# Patient Record
Sex: Male | Born: 1980 | Race: White | Hispanic: No | Marital: Married | Smoking: Never smoker
Health system: Southern US, Community
[De-identification: ages and names within clinical notes are randomized; demographics above are authoritative.]

## PROBLEM LIST (undated history)

## (undated) DIAGNOSIS — S060XAA Concussion with loss of consciousness status unknown, initial encounter: Secondary | ICD-10-CM

## (undated) DIAGNOSIS — J939 Pneumothorax, unspecified: Secondary | ICD-10-CM

## (undated) DIAGNOSIS — S060X9A Concussion with loss of consciousness of unspecified duration, initial encounter: Secondary | ICD-10-CM

---

## 2013-01-30 ENCOUNTER — Ambulatory Visit: Payer: Self-pay | Admitting: Urology

## 2014-08-10 NOTE — Op Note (Signed)
PATIENT NAME:  Rick DaltonMARTIN, Shadee L MR#:  [id number] DATE OF BIRTH:  25-Dec-1980  DATE OF PROCEDURE:  01/30/2013  PREOPERATIVE DIAGNOSIS: Desires infertility.   POSTOPERATIVE DIAGNOSIS: Desires infertility.  PROCEDURE: Bilateral partial vasectomy.   ANESTHESIA: Local MAC.  DESCRIPTION OF PROCEDURE: An appropriate timeout was held. I anesthetized the midline of the scrotum. Then, I anesthetized each vas utilizing a combination of 1% Xylocaine and 0.5% Marcaine. Once they are anesthetized, I made a small incision in the midline of the scrotum and both sides are done exactly the same. The first side is the right side. I locate the vas, bring it into the incision carefully, freeing it of most of its attachments. The vas is then grasped with a small towel clamp. I incised down through the tunics surrounding the mass and isolate the vas itself, peel away all its blood supply. Then I put two 3-0 Vicryl ties at the top of the loop. I then put a stat on the proximal side of the vas and incised the vas above it. Then, a small piece was taken away from the proximal end. The distal end of the vas that has been sutured is then cauterized and dropped back into the scrotum. Then with needlepoint, I cauterized the other end and covered over and separated from the distal vas utilizing a 3-0 Vicryl through the tunics. This was repeated on the opposite side. Minimal bleeding was controlled with electrocautery. One single suture is utilized for the incision of  4-0 Vicryl and I sent the patient to recovery in satisfactory condition with sterile dressings and a Dermabond covering the incision.    ____________________________ Caralyn Guileichard D. Edwyna ShellHart, DO rdh:aw D: 01/30/2013 15:27:36 ET T: 01/30/2013 15:37:44 ET JOB#: [id number]  cc: Caralyn Guileichard D. Edwyna ShellHart, DO, <Dictator> Lott Seelbach D Asiah Browder DO ELECTRONICALLY SIGNED 03/13/2013 7:01

## 2015-02-15 ENCOUNTER — Other Ambulatory Visit: Payer: Self-pay | Admitting: Family Medicine

## 2015-02-15 DIAGNOSIS — R1011 Right upper quadrant pain: Secondary | ICD-10-CM

## 2015-02-19 ENCOUNTER — Ambulatory Visit
Admission: RE | Admit: 2015-02-19 | Discharge: 2015-02-19 | Disposition: A | Discharge status: Home / Self Care | Payer: BLUE CROSS/BLUE SHIELD | Source: Ambulatory Visit | Attending: Family Medicine | Admitting: Family Medicine

## 2015-02-19 ENCOUNTER — Other Ambulatory Visit: Payer: Self-pay | Admitting: Family Medicine

## 2015-02-19 DIAGNOSIS — R1011 Right upper quadrant pain: Secondary | ICD-10-CM | POA: Insufficient documentation

## 2015-02-20 ENCOUNTER — Other Ambulatory Visit: Payer: Self-pay | Admitting: Family Medicine

## 2015-02-20 DIAGNOSIS — R1011 Right upper quadrant pain: Secondary | ICD-10-CM

## 2015-02-28 ENCOUNTER — Ambulatory Visit
Admission: RE | Admit: 2015-02-28 | Discharge: 2015-02-28 | Disposition: A | Discharge status: Home / Self Care | Payer: BLUE CROSS/BLUE SHIELD | Source: Ambulatory Visit | Attending: Family Medicine | Admitting: Family Medicine

## 2015-02-28 ENCOUNTER — Ambulatory Visit: Payer: BLUE CROSS/BLUE SHIELD

## 2015-02-28 DIAGNOSIS — R1011 Right upper quadrant pain: Secondary | ICD-10-CM | POA: Diagnosis present

## 2015-02-28 DIAGNOSIS — K839 Disease of biliary tract, unspecified: Secondary | ICD-10-CM | POA: Insufficient documentation

## 2015-02-28 MED ORDER — TECHNETIUM TC 99M MEBROFENIN IV KIT
5.0000 | PACK | Freq: Once | INTRAVENOUS | Status: DC | PRN
  Administered 2015-02-28: 5.416 via INTRAVENOUS
  Filled 2015-02-28: qty 6

## 2015-02-28 MED ORDER — SINCALIDE 5 MCG IJ SOLR
0.0200 ug/kg | Freq: Once | INTRAMUSCULAR | Status: AC
  Administered 2015-02-28: 2.36 ug via INTRAVENOUS
  Filled 2015-02-28: qty 5

## 2017-06-16 ENCOUNTER — Emergency Department
Admission: EM | Admit: 2017-06-16 | Discharge: 2017-06-16 | Disposition: A | Discharge status: Home / Self Care | Payer: BLUE CROSS/BLUE SHIELD | Attending: Emergency Medicine | Admitting: Emergency Medicine

## 2017-06-16 ENCOUNTER — Other Ambulatory Visit: Payer: Self-pay

## 2017-06-16 DIAGNOSIS — R040 Epistaxis: Secondary | ICD-10-CM | POA: Diagnosis not present

## 2017-06-16 HISTORY — DX: Concussion with loss of consciousness of unspecified duration, initial encounter: S06.0X9A

## 2017-06-16 HISTORY — DX: Pneumothorax, unspecified: J93.9

## 2017-06-16 HISTORY — DX: Concussion with loss of consciousness status unknown, initial encounter: S06.0XAA

## 2017-06-16 MED ORDER — OXYMETAZOLINE HCL 0.05 % NA SOLN
1.0000 | Freq: Once | NASAL | Status: AC
  Administered 2017-06-16: 1 via NASAL
  Filled 2017-06-16: qty 15

## 2017-06-16 MED ORDER — OXYMETAZOLINE HCL 0.05 % NA SOLN: 2.0000 | Freq: Two times a day (BID) | NASAL | 2 refills | Status: AC | PRN

## 2017-06-16 NOTE — ED Notes (Signed)
Pt reports a nosebleed from right nares.  Pt was opening a door when the nosebleed began.  No bleeding now.  Hx htn.  No meds at this time for htn.  No headache.  Pt alert.  Speech clear.

## 2017-06-16 NOTE — ED Triage Notes (Addendum)
Pt arrives to ED with c/o nose bleed since 4pm. Denies blood thinner use. No hx nose bleeds. Alert, oriented, holding pressure in triage with nasal clamp to nose. Removed rag pt had been holding nose with in triage. Large clot present. No bleeding noted after clot removed. Clamp still in place.

## 2017-06-16 NOTE — ED Provider Notes (Signed)
[facility] Emergency Department Provider Note  ____________________________________________  Time seen: Approximately 5:01 PM  I have reviewed the triage vital signs and the nursing notes.   HISTORY  Chief Complaint Epistaxis    HPI Rick Leonard is a 37 y.o. male who presents the emergency department complaining of nosebleed.  Patient reports that he does not have a history of epistaxis but today he had a spontaneous nosebleed.  No trauma.  Patient reports that he has just recently been diagnosed with flu, went through the progression and was feeling better.  Patient reports that today he noticed a spontaneous nosebleed, he put direct pressure over his nose which helped control bleeding.  Patient reports that he did had a significant amount of bleeding, and had 2 large clots removed.  Patient states that since he has had a nose clamp on his nose, no return of bleeding.  He has not felt no postnasal drip/bleeding.  No bleeding disorders.  Patient is on no blood thinners.  No medications for this complaint prior to arrival.  No other complaints at this time.  Patient reports that majority of nosebleed has occurred on right side.  Patient does have a history of hypertension but was recently discontinued off of his hypertensive medications.  Past Medical History:  Diagnosis Date  . Concussion   . Pneumothorax     There are no active problems to display for this patient.   History reviewed. No pertinent surgical history.  Prior to Admission medications   Medication Sig Start Date End Date Taking? Authorizing Provider  oxymetazoline (AFRIN) 0.05 % nasal spray Place 2 sprays into both nostrils 2 (two) times daily as needed (epistaxis). 06/16/17   Cuthriell, Delorise RoyalsJonathan D, PA-C    Allergies Patient has no known allergies.  History reviewed. No pertinent family history.  Social History Social History   Tobacco Use  . Smoking status: Never Smoker  Substance  Use Topics  . Alcohol use: Yes  . Drug use: Not on file     Review of Systems  Constitutional: No fever/chills Eyes: No visual changes. No discharge ENT: Positive for epistaxis Cardiovascular: no chest pain. Respiratory: no cough. No SOB. Gastrointestinal: No abdominal pain.  No nausea, no vomiting.  Musculoskeletal: Negative for musculoskeletal pain. Skin: Negative for rash, abrasions, lacerations, ecchymosis. Neurological: Negative for headaches, focal weakness or numbness. 10-point ROS otherwise negative.  ____________________________________________   PHYSICAL EXAM:  VITAL SIGNS: ED Triage Vitals [06/16/17 1640]  Enc Vitals Group     BP (!) 153/83     Pulse Rate (!) 101     Resp 18     Temp (!) 97.5 F (36.4 C)     Temp Source Oral     SpO2 97 %     Weight 223 lb (101.2 kg)     Height 6\' 1"  (1.854 m)     Head Circumference      Peak Flow      Pain Score 0     Pain Loc      Pain Edu?      Excl. in GC?      Constitutional: Alert and oriented. Well appearing and in no acute distress. Eyes: Conjunctivae are normal. PERRL. EOMI. Head: Atraumatic. ENT:      Ears:       Nose: No congestion/rhinnorhea.  Patient with signs of epistaxis bilaterally, scabbing over right Kiesselbach plexus is appreciated.  No current bleeding.  Large clot had been removed prior to my assessment.  Mouth/Throat: Mucous membranes are moist.  Neck: No stridor.    Cardiovascular: Normal rate, regular rhythm. Normal S1 and S2.  Good peripheral circulation. Respiratory: Normal respiratory effort without tachypnea or retractions. Lungs CTAB. Good air entry to the bases with no decreased or absent breath sounds. Musculoskeletal: Full range of motion to all extremities. No gross deformities appreciated. Neurologic:  Normal speech and language. No gross focal neurologic deficits are appreciated.  Skin:  Skin is warm, dry and intact. No rash noted. Psychiatric: Mood and affect are normal.  Speech and behavior are normal. Patient exhibits appropriate insight and judgement.   ____________________________________________   LABS (all labs ordered are listed, but only abnormal results are displayed)  Labs Reviewed - No data to display ____________________________________________  EKG   ____________________________________________  RADIOLOGY   No results found.  ____________________________________________    PROCEDURES  Procedure(s) performed:    Procedures    Medications  oxymetazoline (AFRIN) 0.05 % nasal spray 1 spray (1 spray Each Nare Given 06/16/17 1721)     ____________________________________________   INITIAL IMPRESSION / ASSESSMENT AND PLAN / ED COURSE  Pertinent labs & imaging results that were available during my care of the patient were reviewed by me and considered in my medical decision making (see chart for details).  Review of the [place] CSRS was performed in accordance of the [facility] prior to dispensing any controlled drugs.  Clinical Course as of Jun 17 1739  Wed Jun 16, 2017  1716 Patient presented with right-sided epistaxis.  Patient denies any trauma to the area.  He recently was sick as well as significant changes in weather recently.  At this time, I suspect that bleeding is from the Kiesselbach plexus with scabbing appreciated on exam.  No current bleeding.  Nose clamp was removed, Afrin is administered to bilateral nares.  I will reevaluate in 15 minutes.  If there is no return of bleeding, patient will be discharged.  If patient has return of bleeding, Rhino Rocket will be inserted, patient will be placed on antibiotics, and he will follow-up with ENT surgery.  [JC]    Clinical Course User Index [JC] Cuthriell, Delorise Royals, PA-C    Patient's diagnosis is consistent with epistaxis.  Patient's epistaxis resolved with nose clamp, Afrin.  No return of bleeding in the emergency department. at this time, no indication for labs or  imaging.. Patient will be discharged home with prescriptions for Afrin. Patient is to follow up with ENT as needed or otherwise directed. Patient is given ED precautions to return to the ED for any worsening or new symptoms.     ____________________________________________  FINAL CLINICAL IMPRESSION(S) / ED DIAGNOSES  Final diagnoses:  Epistaxis      NEW MEDICATIONS STARTED DURING THIS VISIT:  ED Discharge Orders        Ordered    oxymetazoline (AFRIN) 0.05 % nasal spray  2 times daily PRN     06/16/17 1739          This chart was dictated using voice recognition software/Dragon. Despite best efforts to proofread, errors can occur which can change the meaning. Any change was purely unintentional.    Racheal Patches, PA-C 06/16/17 1741    Phineas Semen, MD 06/16/17 [phone]

## 2017-08-26 IMAGING — US US ABDOMEN LIMITED
1 series · 14 of 25 positions shown · non-contrast
Comparison: None in PACs

CLINICAL DATA: Right upper quadrant abdominal pain

EXAM:
US ABDOMEN LIMITED - RIGHT UPPER QUADRANT

[Series 1: us abdomen limited · 0.32mm/px · 14 of 46 slices shown]
[im 1/46]
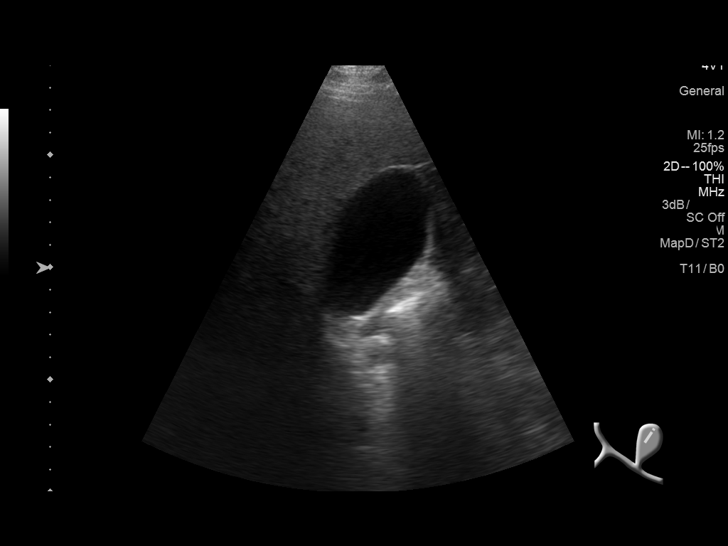
[im 4/46]
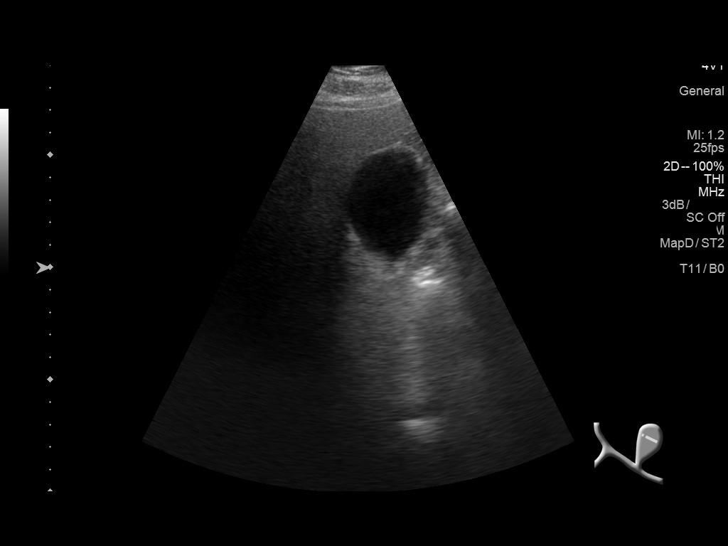
[im 8/46]
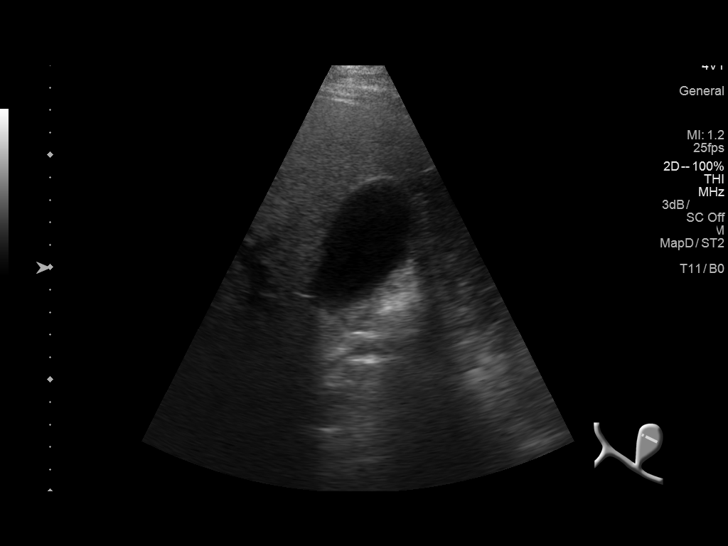
[im 12/46]
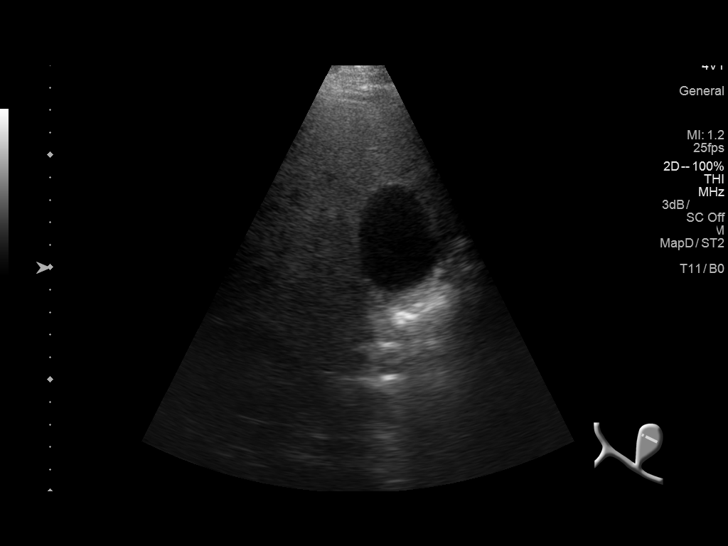
[im 16/46]
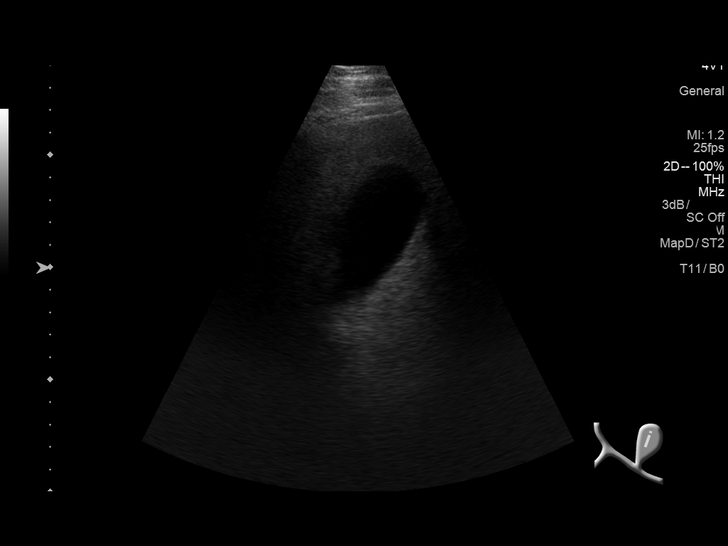
[im 17/46]
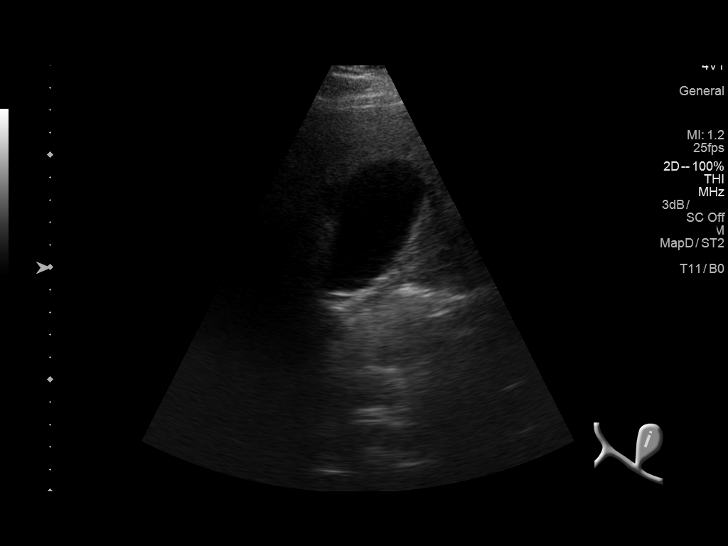
[im 21/46]
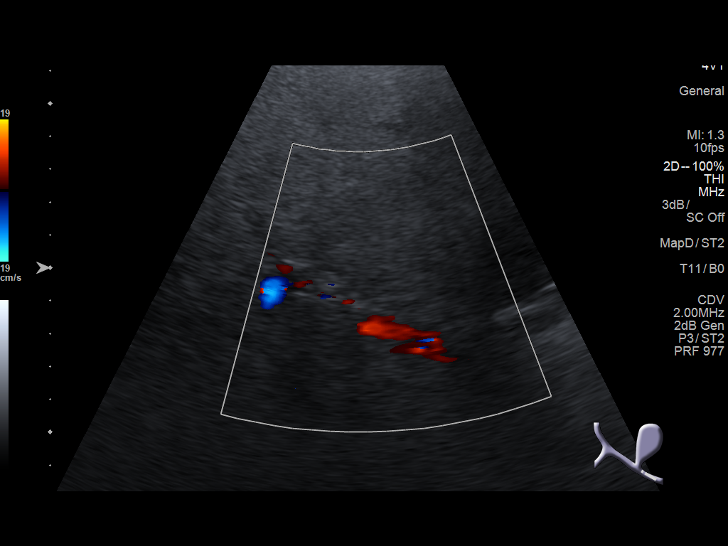
[im 25/46]
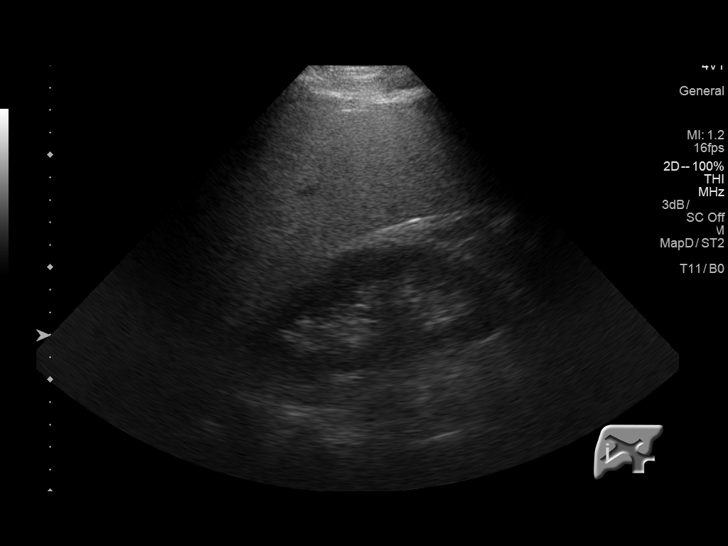
[im 29/46]
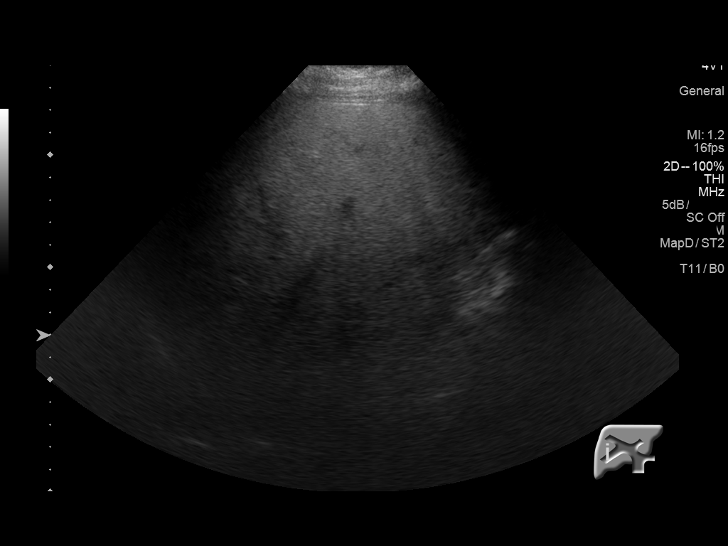
[im 31/46]
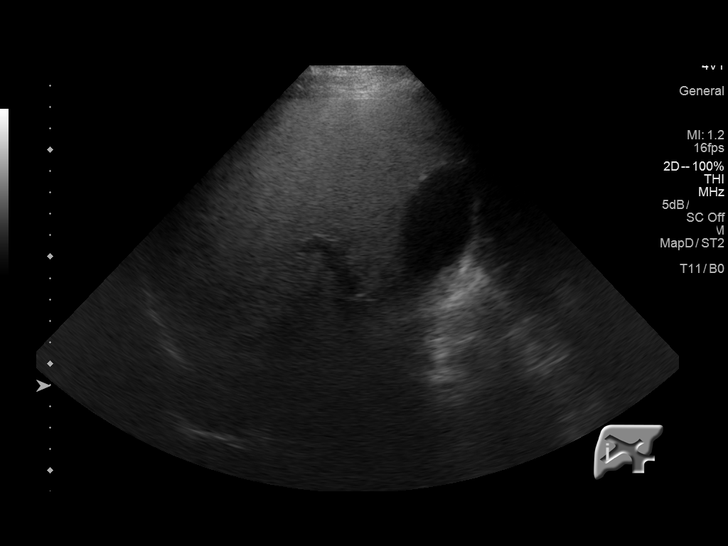
[im 34/46]
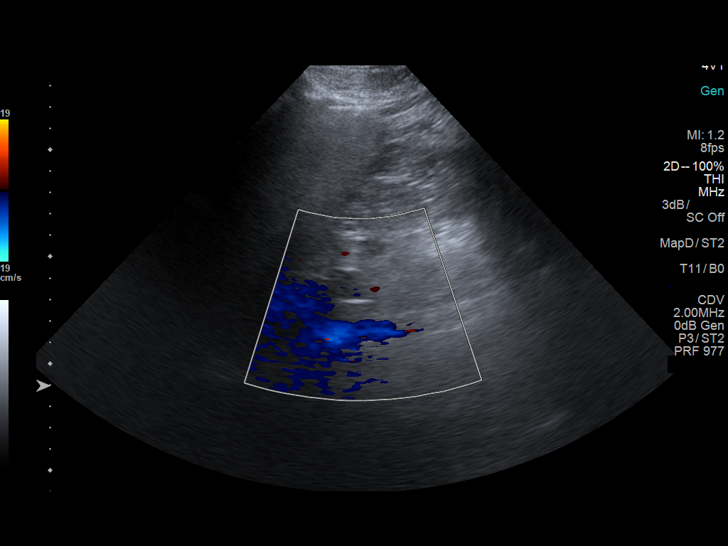
[im 38/46]
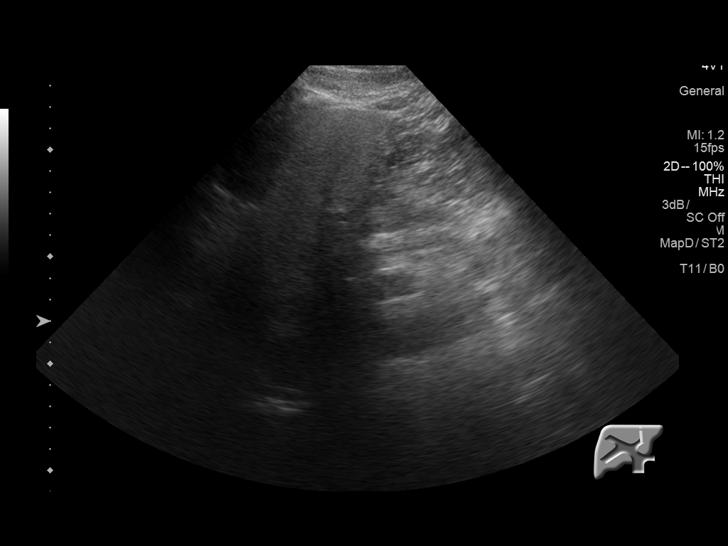
[im 42/46]
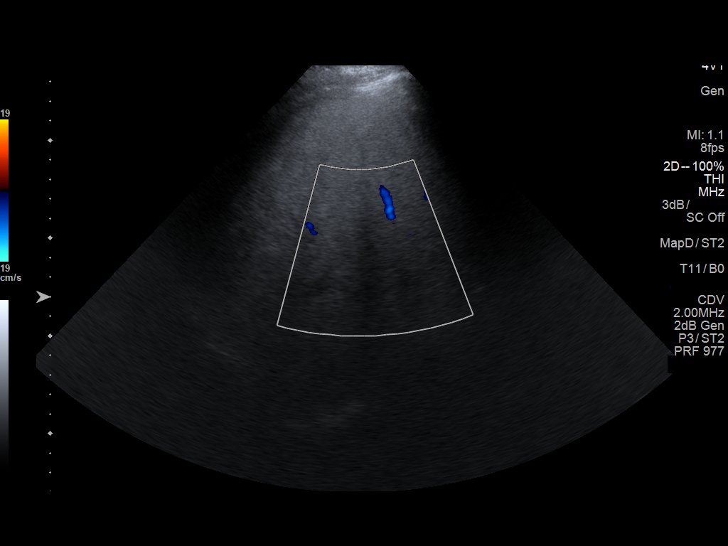
[im 46/46]
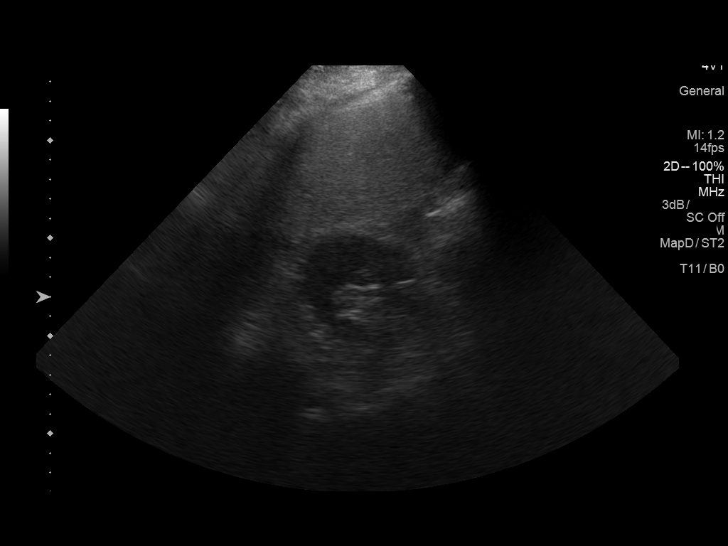

[14 of 25 positions shown; findings below may reference images not displayed]

FINDINGS: Gallbladder:

No gallstones or wall thickening visualized. No sonographic Murphy
sign noted.

Common bile duct:

Diameter: 3.9 mm which is normal

Liver:

The hepatic echotexture is mildly increased diffusely. There is no
focal mass or ductal dilation.
IMPRESSION: Probable fatty infiltrative change of the liver. No acute
abnormality of the liver or gallbladder. If there are strong
clinical concerns of gallbladder dysfunction, a nuclear medicine
hepatobiliary scan may be useful.

## 2024-01-16 ENCOUNTER — Other Ambulatory Visit: Payer: Self-pay

## 2024-01-16 ENCOUNTER — Encounter: Payer: Self-pay | Admitting: Emergency Medicine

## 2024-01-16 ENCOUNTER — Emergency Department
Admission: EM | Admit: 2024-01-16 | Discharge: 2024-01-16 | Disposition: A | Discharge status: Home / Self Care | Payer: Self-pay | Attending: Emergency Medicine | Admitting: Emergency Medicine

## 2024-01-16 DIAGNOSIS — I10 Essential (primary) hypertension: Secondary | ICD-10-CM | POA: Insufficient documentation

## 2024-01-16 DIAGNOSIS — R040 Epistaxis: Secondary | ICD-10-CM | POA: Insufficient documentation

## 2024-01-16 MED ORDER — OXYMETAZOLINE HCL 0.05 % NA SOLN
1.0000 | Freq: Once | NASAL | Status: AC
  Administered 2024-01-16: 1 via NASAL
  Filled 2024-01-16: qty 30

## 2024-01-16 MED ORDER — CLONIDINE HCL 0.1 MG PO TABS
0.2000 mg | ORAL_TABLET | Freq: Once | ORAL | Status: AC
  Administered 2024-01-16: 0.2 mg via ORAL
  Filled 2024-01-16: qty 2

## 2024-01-16 MED ORDER — AMLODIPINE BESYLATE 5 MG PO TABS: 5.0000 mg | ORAL_TABLET | Freq: Every day | ORAL | 1 refills | Status: AC

## 2024-01-16 NOTE — ED Triage Notes (Signed)
 Patient to ED via POV for nose bleed. Started today while golfing. Unable to get it to stop- started at 11am. Had nose bleed last night as well but able to get it stopped. Clip placed in triage. Denies blood thinners.

## 2024-01-16 NOTE — ED Provider Notes (Signed)
 [facility] Provider Note    Event Date/Time   First MD Initiated Contact with Patient 01/16/24 1146     (approximate)   History   Epistaxis   HPI  Rick Leonard is a 43 y.o. male with no significant past medical history presents emergency department with a nosebleed.  Patient states suddenly started bleeding while on the golf course.  Had this happen a few years ago.  Has not happened since then.  His other concern is that he had a tear duct in his eye rerouted.  Stent was removed approximately on the 18th.  Concerned that this may have been part of the problem with the bleeding.  Clamps are in place from triage.  No recent nose injury, no cold symptoms etc.      Physical Exam   Triage Vital Signs: ED Triage Vitals [01/16/24 1133]  Encounter Vitals Group     BP (!) 189/121     Girls Systolic BP Percentile      Girls Diastolic BP Percentile      Boys Systolic BP Percentile      Boys Diastolic BP Percentile      Pulse Rate (!) 106     Resp 17     Temp 97.8 F (36.6 C)     Temp Source Oral     SpO2 97 %     Weight 235 lb (106.6 kg)     Height 6' 2 (1.88 m)     Head Circumference      Peak Flow      Pain Score 0     Pain Loc      Pain Education      Exclude from Growth Chart     Most recent vital signs: Vitals:   01/16/24 1234 01/16/24 1307  BP: (!) 162/111 (!) 154/104  Pulse:  100  Resp:  16  Temp:    SpO2:  97%     General: Awake, no distress.   CV:  Good peripheral perfusion.  Resp:  Normal effort.  Abd:  No distention.   Other:  Right nare with large clot noted in the canal, left nare with swollen turbinates but no bleeding, had patient clear the clot, tiny amount of bleeding noted   ED Results / Procedures / Treatments   Labs (all labs ordered are listed, but only abnormal results are displayed) Labs Reviewed - No data to display   EKG     RADIOLOGY     PROCEDURES:   Epistaxis Management  Date/Time:  01/16/2024 12:08 PM  Performed by: Gasper Devere ORN, PA-C Authorized by: Gasper Devere ORN, PA-C   Consent:    Consent obtained:  Verbal   Consent given by:  Patient   Risks, benefits, and alternatives were discussed: yes     Risks discussed:  Bleeding, infection, nasal injury and pain   Alternatives discussed:  No treatment Universal protocol:    Procedure explained and questions answered to patient or proxy's satisfaction: yes     Immediately prior to procedure, a time out was called: yes     Patient identity confirmed:  Verbally with patient Anesthesia:    Anesthesia method:  None Procedure details:    Treatment site:  R anterior   Treatment complexity:  Limited   Treatment episode: recurring   Post-procedure details:    Assessment:  Bleeding stopped Comments:     Afrin placed in right nare, clamps applied   Critical Care:  no  Chief Complaint  Patient presents with   Epistaxis      MEDICATIONS ORDERED IN ED: Medications  oxymetazoline  (AFRIN) 0.05 % nasal spray 1 spray (1 spray Each Nare Given by Other 01/16/24 1205)  cloNIDine (CATAPRES) tablet 0.2 mg (0.2 mg Oral Given 01/16/24 1249)     IMPRESSION / MDM / ASSESSMENT AND PLAN / ED COURSE  I reviewed the triage vital signs and the nursing notes.                              Differential diagnosis includes, but is not limited to, anterior epistaxis, posterior epistaxis, infection  Patient's presentation is most consistent with acute illness / injury with system symptoms.    Medications given: Afrin  No bleeding noted at this time.  However the patient's blood pressure remains elevated.  On arrival his blood pressure was 189/121, it remains at 162/111.  Patient is on a estrogen blocker to help increase his testosterone.  States the blood pressure has been high for a little bit and his physician has been monitoring it.  Since he has had a nosebleed we will go ahead and give him clonidine 0.2 mg here, will start him on  amlodipine 5 mg at discharge.  Follow-up with his doctor to discuss continuing the blood pressure medication.  It could be that when he stops the testosterone type medications that the blood pressure returned to normal.  Patient is in agreement with this treatment plan.   On recheck of patient no bleeding.  Will discharge him to home.  Prescription for amlodipine.  Follow-up with his doctor.  Follow-up with ENT if continued nosebleeds.  He is in agreement treatment plan.  He was discharged stable condition.   FINAL CLINICAL IMPRESSION(S) / ED DIAGNOSES   Final diagnoses:  Epistaxis  Hypertension, unspecified type     Rx / DC Orders   ED Discharge Orders          Ordered    amLODipine (NORVASC) 5 MG tablet  Daily        01/16/24 1322             Note:  This document was prepared using Dragon voice recognition software and may include unintentional dictation errors.    Gasper Devere ORN, PA-C 01/16/24 1328    Dorothyann Drivers, MD 01/16/24 RETHA
